# Patient Record
Sex: Male | Born: 1950 | Race: Black or African American | Hispanic: No | Smoking: Current every day smoker
Health system: Southern US, Community
[De-identification: ages and names within clinical notes are randomized; demographics above are authoritative.]

## PROBLEM LIST (undated history)

## (undated) DIAGNOSIS — M549 Dorsalgia, unspecified: Secondary | ICD-10-CM

## (undated) DIAGNOSIS — M1990 Unspecified osteoarthritis, unspecified site: Secondary | ICD-10-CM

## (undated) DIAGNOSIS — E785 Hyperlipidemia, unspecified: Secondary | ICD-10-CM

## (undated) DIAGNOSIS — R011 Cardiac murmur, unspecified: Secondary | ICD-10-CM

## (undated) DIAGNOSIS — G8929 Other chronic pain: Secondary | ICD-10-CM

## (undated) DIAGNOSIS — I10 Essential (primary) hypertension: Secondary | ICD-10-CM

## (undated) DIAGNOSIS — F319 Bipolar disorder, unspecified: Secondary | ICD-10-CM

## (undated) HISTORY — PX: NECK SURGERY: SHX720

---

## 1999-04-21 ENCOUNTER — Encounter: Payer: Self-pay | Admitting: Emergency Medicine

## 1999-04-21 ENCOUNTER — Emergency Department (HOSPITAL_COMMUNITY): Admission: EM | Admit: 1999-04-21 | Discharge: 1999-04-21 | Payer: Self-pay | Admitting: Emergency Medicine

## 2003-05-29 ENCOUNTER — Ambulatory Visit (HOSPITAL_COMMUNITY): Admission: RE | Admit: 2003-05-29 | Discharge: 2003-05-30 | Payer: Self-pay | Admitting: Neurological Surgery

## 2003-07-17 ENCOUNTER — Encounter: Admission: RE | Admit: 2003-07-17 | Discharge: 2003-08-18 | Payer: Self-pay | Admitting: Neurological Surgery

## 2003-10-23 ENCOUNTER — Emergency Department (HOSPITAL_COMMUNITY): Admission: EM | Admit: 2003-10-23 | Discharge: 2003-10-23 | Payer: Self-pay | Admitting: Emergency Medicine

## 2004-10-21 ENCOUNTER — Emergency Department (HOSPITAL_COMMUNITY): Admission: EM | Admit: 2004-10-21 | Discharge: 2004-10-22 | Payer: Self-pay | Admitting: Emergency Medicine

## 2004-11-17 ENCOUNTER — Ambulatory Visit: Payer: Self-pay | Admitting: Gastroenterology

## 2004-12-01 ENCOUNTER — Ambulatory Visit: Payer: Self-pay | Admitting: Gastroenterology

## 2008-04-05 ENCOUNTER — Encounter: Admission: RE | Admit: 2008-04-05 | Discharge: 2008-04-05 | Payer: Self-pay | Admitting: Internal Medicine

## 2010-04-17 ENCOUNTER — Emergency Department (HOSPITAL_COMMUNITY)
Admission: EM | Admit: 2010-04-17 | Discharge: 2010-04-17 | Payer: Self-pay | Source: Home / Self Care | Admitting: Emergency Medicine

## 2010-08-13 NOTE — Op Note (Signed)
Dave Coleman, Dave Coleman                          ACCOUNT NO.:  [id number]   MEDICAL RECORD NO.:  [id number]                   PATIENT TYPE:  OIB   LOCATION:  2899                                 FACILITY:  [facility]   PHYSICIAN:  Stefani Dama, M.D.               DATE OF BIRTH:  09/19/1950   DATE OF PROCEDURE:  05/29/2003  DATE OF DISCHARGE:                                 OPERATIVE REPORT   PREOPERATIVE DIAGNOSIS:  Herniated nucleus pulposus and spondylosis, C5-6  and C6-7 with cervical radiculopathy.   POSTOPERATIVE DIAGNOSIS:  Herniated nucleus pulposus and spondylosis, C5-6  and C6-7 with cervical radiculopathy.   OPERATION PERFORMED:  Anterior cervical decompression, C5-6, C6-7,  arthrodesis with structural allograft and Synthes plate fixation, C5 to C7.   SURGEON:  Stefani Dama, M.D.   ASSISTANT:  Cristi Loron, M.D.   ANESTHESIA:  General endotracheal.   INDICATIONS FOR PROCEDURE:  The patient is a 60 year old individual who has  had significant neck, shoulder and arm pain initially on the left side, now  on both sides.  He has evidence of spondylitic disease at C5-6 with  herniated nucleus pulposus bilaterally and on the left side at C6-7.  He has  been advised conservative treatment and surgical decompression and is now  taken to the operating room.   DESCRIPTION OF PROCEDURE:  The patient was brought to the operating room  supine on a stretcher.  After smooth induction of general endotracheal  anesthesia, he was placed in five pounds of halter traction.  The neck was  shaved, prepped with DuraPrep and draped in sterile fashion.  Transverse  incision was made in the left side of the neck and this was carried down  through the platysma.  The plane between the sternocleidomastoid and strap  muscles was dissected bluntly until the prevertebral space was reached.  The  first identifiable disk space was known to be that of C5-C6.  The longus  colli muscle was stripped  on either side and then a Caspar retractor was  placed in the wound to expose C5-6 and also C6-7.  Ventral osteophytes were  then removed.  The disk space was entered and a significant quantity of  severely degenerated disk material was removed from within the disk space at  C5-C6.  The dissection was carried posteriorly to expose the hypertrophied  lip of the vertebral body of C5 and posteriorly.  This was then taken down  with a high speed air drill and a 2 mm Kerrison punch was used to remove it.  Dissection was carried out laterally to expose the take off of the nerve  root on side.  There was noted to be significant disk material in each  lateral recess.  This was removed and allowed for good decompression.  The  disk space spreader was used to facilitate this dissection.  In the end, the  common dural tube  and the take off for the nerve roots were well dissected  and clear.  A 6 mm round fibular graft cut in lordotic shape was placed into  this interspace after it was filled with the patient's own bone that had  been harvested from the resulting dissection of the uncinate processes.  Attention was then turned to C6-7 where a similar procedure was carried out  here.  On the left side there was significant subligamentous disk herniation  near the region of the foramen.  This was decompressed and again hemostasis  in the lateral recesses was obtained meticulously.  A 7 mm round graft was  again filled with remnants of bone from the lateral dissection and traction  was then removed and the area was inspected and a 40 mm standard size  Synthes plate was contoured to the prevertebral space and then fitted with  six locking 4 x 14 mm screws.  A final localizing radiograph identified good  position of the construct.  The area was then copiously irrigated with  antibiotic irrigating solution.  Hemostasis in the soft tissues was  meticulously obtained.  The wound was then closed with 3-0 Vicryl  in the  platysma and 3-0 Vicryl in the subcuticular tissues.  The procedure was  completed with the help of Dr. Delma Officer who provided retraction,  exposure as I did the dissection of the disk spaces.                                               Stefani Dama, M.D.    Merla Riches  D:  05/29/2003  T:  05/30/2003  Job:  [id number]

## 2012-10-24 ENCOUNTER — Emergency Department (HOSPITAL_COMMUNITY)
Admission: EM | Admit: 2012-10-24 | Discharge: 2012-10-25 | Disposition: A | Discharge status: Home / Self Care | Payer: Medicare Other | Attending: Emergency Medicine | Admitting: Emergency Medicine

## 2012-10-24 ENCOUNTER — Encounter (HOSPITAL_COMMUNITY): Payer: Self-pay | Admitting: *Deleted

## 2012-10-24 DIAGNOSIS — I10 Essential (primary) hypertension: Secondary | ICD-10-CM | POA: Insufficient documentation

## 2012-10-24 DIAGNOSIS — R011 Cardiac murmur, unspecified: Secondary | ICD-10-CM | POA: Insufficient documentation

## 2012-10-24 DIAGNOSIS — Z862 Personal history of diseases of the blood and blood-forming organs and certain disorders involving the immune mechanism: Secondary | ICD-10-CM | POA: Insufficient documentation

## 2012-10-24 DIAGNOSIS — F330 Major depressive disorder, recurrent, mild: Secondary | ICD-10-CM

## 2012-10-24 DIAGNOSIS — F17200 Nicotine dependence, unspecified, uncomplicated: Secondary | ICD-10-CM | POA: Insufficient documentation

## 2012-10-24 DIAGNOSIS — M549 Dorsalgia, unspecified: Secondary | ICD-10-CM | POA: Insufficient documentation

## 2012-10-24 DIAGNOSIS — Z8659 Personal history of other mental and behavioral disorders: Secondary | ICD-10-CM | POA: Insufficient documentation

## 2012-10-24 DIAGNOSIS — Z8739 Personal history of other diseases of the musculoskeletal system and connective tissue: Secondary | ICD-10-CM | POA: Insufficient documentation

## 2012-10-24 DIAGNOSIS — Z79899 Other long term (current) drug therapy: Secondary | ICD-10-CM | POA: Insufficient documentation

## 2012-10-24 DIAGNOSIS — G8929 Other chronic pain: Secondary | ICD-10-CM | POA: Insufficient documentation

## 2012-10-24 DIAGNOSIS — Z8639 Personal history of other endocrine, nutritional and metabolic disease: Secondary | ICD-10-CM | POA: Insufficient documentation

## 2012-10-24 HISTORY — DX: Unspecified osteoarthritis, unspecified site: M19.90

## 2012-10-24 HISTORY — DX: Dorsalgia, unspecified: M54.9

## 2012-10-24 HISTORY — DX: Other chronic pain: G89.29

## 2012-10-24 HISTORY — DX: Essential (primary) hypertension: I10

## 2012-10-24 HISTORY — DX: Hyperlipidemia, unspecified: E78.5

## 2012-10-24 HISTORY — DX: Bipolar disorder, unspecified: F31.9

## 2012-10-24 HISTORY — DX: Cardiac murmur, unspecified: R01.1

## 2012-10-24 LAB — CBC WITH DIFFERENTIAL/PLATELET
Basophils Absolute: 0 10*3/uL (ref 0.0–0.1)
Eosinophils Absolute: 0 10*3/uL (ref 0.0–0.7)
Eosinophils Relative: 1 % (ref 0–5)
HCT: 44.2 % (ref 39.0–52.0)
Lymphocytes Relative: 23 % (ref 12–46)
MCH: 32.7 pg (ref 26.0–34.0)
MCHC: 36.4 g/dL — ABNORMAL HIGH (ref 30.0–36.0)
MCV: 89.8 fL (ref 78.0–100.0)
Monocytes Absolute: 0.5 10*3/uL (ref 0.1–1.0)
RDW: 13.9 % (ref 11.5–15.5)
WBC: 5.1 10*3/uL (ref 4.0–10.5)

## 2012-10-24 LAB — URINALYSIS, ROUTINE W REFLEX MICROSCOPIC
Glucose, UA: NEGATIVE mg/dL
Hgb urine dipstick: NEGATIVE
Ketones, ur: 15 mg/dL — AB
Protein, ur: 30 mg/dL — AB

## 2012-10-24 LAB — URINE MICROSCOPIC-ADD ON

## 2012-10-24 LAB — COMPREHENSIVE METABOLIC PANEL
AST: 23 U/L (ref 0–37)
CO2: 28 mEq/L (ref 19–32)
Calcium: 9.7 mg/dL (ref 8.4–10.5)
Creatinine, Ser: 1.09 mg/dL (ref 0.50–1.35)
GFR calc Af Amer: 83 mL/min — ABNORMAL LOW (ref >90)
GFR calc non Af Amer: 72 mL/min — ABNORMAL LOW (ref >90)

## 2012-10-24 LAB — RAPID URINE DRUG SCREEN, HOSP PERFORMED
Amphetamines: NOT DETECTED
Benzodiazepines: POSITIVE — AB
Opiates: POSITIVE — AB
Tetrahydrocannabinol: NOT DETECTED

## 2012-10-24 MED ORDER — HYDROCODONE-ACETAMINOPHEN 5-325 MG PO TABS: 2.0000 | ORAL_TABLET | Freq: Four times a day (QID) | ORAL | Status: DC | PRN

## 2012-10-24 MED ORDER — PANTOPRAZOLE SODIUM 40 MG PO TBEC: 40.0000 mg | DELAYED_RELEASE_TABLET | Freq: Every day | ORAL | Status: DC

## 2012-10-24 MED ORDER — AMLODIPINE BESYLATE 10 MG PO TABS
10.0000 mg | ORAL_TABLET | Freq: Every day | ORAL | Status: DC
  Filled 2012-10-24: qty 1

## 2012-10-24 NOTE — ED Notes (Signed)
The pt is not feeling like himself for a long time and he has had some outbursts that he does not remember.  He thinks he is reacting to some of his meds he does not know what/.  Strange affect

## 2012-10-24 NOTE — ED Notes (Signed)
[phone] [facility] [place] emergency contact

## 2012-10-24 NOTE — ED Provider Notes (Signed)
CSN: [id number]     Arrival date & time 10/24/12  1849 History     First MD Initiated Contact with Patient 10/24/12 2328     Chief Complaint  Patient presents with  . not feeling like himself     HPI Patient has a history of bipolar disorder as well as manic depression.  He reports that he can emergency department today because he doesn't feel himself.  It sounds as though he had a violent outburst 1-2 weeks ago and is concerned him.  He does not remember the incident.  He denies drug or alcohol abuse.  He states compliance with his medications.  His psychiatrist is at the [facility] in [place].  He did not call psychiatrist today.  He called his cousin and asked that they bring him to the emergency department.  He states he feels scared to go home.  He denies suicidal and homicidal thoughts.  He is unable to elucidate why he feels scared to go home.   History reviewed. No pertinent past medical history. History reviewed. No pertinent past surgical history. No family history on file. History  Substance Use Topics  . Smoking status: Current Every Day Smoker  . Smokeless tobacco: Not on file  . Alcohol Use: No    Review of Systems  All other systems reviewed and are negative.    Allergies  Review of patient's allergies indicates not on file.  Home Medications   Current Outpatient Rx  Name  Route  Sig  Dispense  Refill  . amLODipine (NORVASC) 10 MG tablet   Oral   Take 10 mg by mouth daily.         Marland Kitchen HYDROcodone-acetaminophen (NORCO) 10-325 MG per tablet   Oral   Take 1 tablet by mouth every 6 (six) hours as needed for pain.         Marland Kitchen omeprazole (PRILOSEC) 20 MG capsule   Oral   Take 20 mg by mouth daily.          BP 159/99  Pulse 90  Temp(Src) 97.9 F (36.6 C) (Oral)  Resp 18  Ht 5\' 9"  (1.753 m)  Wt 200 lb (90.719 kg)  BMI 29.52 kg/m2  SpO2 98% Physical Exam  Nursing note and vitals reviewed. Constitutional: He is oriented to person, place, and time. He  appears well-developed and well-nourished.  HENT:  Head: Normocephalic and atraumatic.  Eyes: EOM are normal.  Neck: Normal range of motion.  Cardiovascular: Normal rate, regular rhythm, normal heart sounds and intact distal pulses.   Pulmonary/Chest: Effort normal and breath sounds normal. No respiratory distress.  Abdominal: Soft. He exhibits no distension. There is no tenderness.  Genitourinary: Rectum normal.  Musculoskeletal: Normal range of motion.  Neurological: He is alert and oriented to person, place, and time.  Skin: Skin is warm and dry.  Psychiatric: He has a normal mood and affect. His behavior is normal. Judgment normal. His speech is delayed. Thought content is not paranoid. He expresses no homicidal and no suicidal ideation. He exhibits abnormal recent memory.    ED Course   Procedures (including critical care time)  Labs Reviewed  URINE RAPID DRUG SCREEN (HOSP PERFORMED) - Abnormal; Notable for the following:    Opiates POSITIVE (*)    Benzodiazepines POSITIVE (*)    All other components within normal limits  URINALYSIS, ROUTINE W REFLEX MICROSCOPIC - Abnormal; Notable for the following:    Color, Urine AMBER (*)    Bilirubin Urine LARGE (*)  Ketones, ur 15 (*)    Protein, ur 30 (*)    Leukocytes, UA TRACE (*)    All other components within normal limits  CBC WITH DIFFERENTIAL - Abnormal; Notable for the following:    MCHC 36.4 (*)    All other components within normal limits  COMPREHENSIVE METABOLIC PANEL - Abnormal; Notable for the following:    Potassium 3.3 (*)    Glucose, Bld 141 (*)    Total Protein 8.5 (*)    GFR calc non Af Amer 72 (*)    GFR calc Af Amer 83 (*)    All other components within normal limits  URINE MICROSCOPIC-ADD ON - Abnormal; Notable for the following:    Squamous Epithelial / LPF FEW (*)    Bacteria, UA FEW (*)    All other components within normal limits  ETHANOL   No results found. No diagnosis found.  MDM  Given the  fact that the patient does not visit the emergency room very often it seems to be compliant with his medications.  The last the psychiatrist see the patient emergency department via tele psychiatry services.  Holding orders written.  I suspect that the patient is okay to go home with close outpatient followup however I will rely on out the expertise of the psychiatrist  Lyanne Co, MD 10/25/12 [phone]

## 2012-10-25 ENCOUNTER — Encounter (HOSPITAL_COMMUNITY): Payer: Self-pay | Admitting: Emergency Medicine

## 2012-10-25 NOTE — ED Notes (Signed)
Patient received meal tray.  Once he is finished eating, he will be discharged home.

## 2012-10-25 NOTE — ED Notes (Signed)
Patient has been discharged.  Patient is getting dressed and finishing up with breakfast.   Patient ambulated to restroom and tolerated well.

## 2012-10-25 NOTE — ED Provider Notes (Signed)
Psychiatry consultation is appreciated. Patient does not need inpatient care and is discharged.  Dione Booze, MD 10/25/12 0530

## 2012-10-28 ENCOUNTER — Emergency Department (HOSPITAL_COMMUNITY)
Admission: EM | Admit: 2012-10-28 | Discharge: 2012-10-29 | Disposition: A | Discharge status: Home / Self Care | Payer: Medicare Other | Attending: Emergency Medicine | Admitting: Emergency Medicine

## 2012-10-28 ENCOUNTER — Encounter (HOSPITAL_COMMUNITY): Payer: Self-pay | Admitting: *Deleted

## 2012-10-28 DIAGNOSIS — IMO0002 Reserved for concepts with insufficient information to code with codable children: Secondary | ICD-10-CM | POA: Insufficient documentation

## 2012-10-28 DIAGNOSIS — Z862 Personal history of diseases of the blood and blood-forming organs and certain disorders involving the immune mechanism: Secondary | ICD-10-CM | POA: Insufficient documentation

## 2012-10-28 DIAGNOSIS — Z79899 Other long term (current) drug therapy: Secondary | ICD-10-CM | POA: Insufficient documentation

## 2012-10-28 DIAGNOSIS — F17200 Nicotine dependence, unspecified, uncomplicated: Secondary | ICD-10-CM | POA: Insufficient documentation

## 2012-10-28 DIAGNOSIS — R011 Cardiac murmur, unspecified: Secondary | ICD-10-CM | POA: Insufficient documentation

## 2012-10-28 DIAGNOSIS — I10 Essential (primary) hypertension: Secondary | ICD-10-CM | POA: Insufficient documentation

## 2012-10-28 DIAGNOSIS — F411 Generalized anxiety disorder: Secondary | ICD-10-CM | POA: Insufficient documentation

## 2012-10-28 DIAGNOSIS — R509 Fever, unspecified: Secondary | ICD-10-CM | POA: Insufficient documentation

## 2012-10-28 DIAGNOSIS — Z8659 Personal history of other mental and behavioral disorders: Secondary | ICD-10-CM | POA: Insufficient documentation

## 2012-10-28 DIAGNOSIS — F3132 Bipolar disorder, current episode depressed, moderate: Secondary | ICD-10-CM | POA: Diagnosis present

## 2012-10-28 DIAGNOSIS — G8929 Other chronic pain: Secondary | ICD-10-CM | POA: Insufficient documentation

## 2012-10-28 DIAGNOSIS — R451 Restlessness and agitation: Secondary | ICD-10-CM

## 2012-10-28 DIAGNOSIS — Z8639 Personal history of other endocrine, nutritional and metabolic disease: Secondary | ICD-10-CM | POA: Insufficient documentation

## 2012-10-28 DIAGNOSIS — Z8739 Personal history of other diseases of the musculoskeletal system and connective tissue: Secondary | ICD-10-CM | POA: Insufficient documentation

## 2012-10-28 DIAGNOSIS — F319 Bipolar disorder, unspecified: Secondary | ICD-10-CM | POA: Insufficient documentation

## 2012-10-28 LAB — BASIC METABOLIC PANEL
BUN: 6 mg/dL (ref 6–23)
Calcium: 9.9 mg/dL (ref 8.4–10.5)
GFR calc non Af Amer: 70 mL/min — ABNORMAL LOW (ref >90)
Glucose, Bld: 132 mg/dL — ABNORMAL HIGH (ref 70–99)
Sodium: 139 mEq/L (ref 135–145)

## 2012-10-28 LAB — URINALYSIS, ROUTINE W REFLEX MICROSCOPIC
Ketones, ur: NEGATIVE mg/dL
Nitrite: NEGATIVE
Specific Gravity, Urine: 1.019 (ref 1.005–1.030)
Urobilinogen, UA: 0.2 mg/dL (ref 0.0–1.0)

## 2012-10-28 LAB — CBC WITH DIFFERENTIAL/PLATELET
Basophils Relative: 0 % (ref 0–1)
Eosinophils Absolute: 0 10*3/uL (ref 0.0–0.7)
MCH: 31.6 pg (ref 26.0–34.0)
MCHC: 34.9 g/dL (ref 30.0–36.0)
Neutrophils Relative %: 72 % (ref 43–77)
Platelets: 221 10*3/uL (ref 150–400)
RDW: 13.7 % (ref 11.5–15.5)

## 2012-10-28 LAB — RAPID URINE DRUG SCREEN, HOSP PERFORMED
Cocaine: NOT DETECTED
Opiates: POSITIVE — AB

## 2012-10-28 LAB — ETHANOL: Alcohol, Ethyl (B): <11 mg/dL (ref 0–11)

## 2012-10-28 MED ORDER — PANTOPRAZOLE SODIUM 40 MG PO TBEC
40.0000 mg | DELAYED_RELEASE_TABLET | Freq: Every day | ORAL | Status: DC
  Administered 2012-10-29: 40 mg via ORAL
  Filled 2012-10-28: qty 1

## 2012-10-28 MED ORDER — AMLODIPINE BESYLATE 10 MG PO TABS
10.0000 mg | ORAL_TABLET | Freq: Every day | ORAL | Status: DC
  Administered 2012-10-29: 10 mg via ORAL
  Filled 2012-10-28: qty 1

## 2012-10-28 MED ORDER — ZOLPIDEM TARTRATE 5 MG PO TABS: 5.0000 mg | ORAL_TABLET | Freq: Every evening | ORAL | Status: DC | PRN

## 2012-10-28 MED ORDER — NICOTINE 21 MG/24HR TD PT24: 21.0000 mg | MEDICATED_PATCH | Freq: Every day | TRANSDERMAL | Status: DC

## 2012-10-28 MED ORDER — HYDROCODONE-ACETAMINOPHEN 10-325 MG PO TABS: 1.0000 | ORAL_TABLET | Freq: Four times a day (QID) | ORAL | Status: DC | PRN

## 2012-10-28 MED ORDER — POTASSIUM CHLORIDE CRYS ER 20 MEQ PO TBCR
20.0000 meq | EXTENDED_RELEASE_TABLET | Freq: Once | ORAL | Status: AC
  Administered 2012-10-28: 20 meq via ORAL
  Filled 2012-10-28: qty 1

## 2012-10-28 MED ORDER — ONDANSETRON HCL 4 MG PO TABS: 4.0000 mg | ORAL_TABLET | Freq: Three times a day (TID) | ORAL | Status: DC | PRN

## 2012-10-28 MED ORDER — IBUPROFEN 200 MG PO TABS: 600.0000 mg | ORAL_TABLET | Freq: Three times a day (TID) | ORAL | Status: DC | PRN

## 2012-10-28 NOTE — ED Notes (Signed)
Pt presents with [facility] from [facility], pt not clear as to why he called 911 to go to [facility] other than at some point his doctor from the [place] told him he needs to go for help. Pt has fever and sore throat

## 2012-10-28 NOTE — ED Notes (Signed)
Sister-in-law states pts ex-wife is critically ill and he is having difficulty dealing with it. She also feels he has been drinking ETOH although he denies.

## 2012-10-28 NOTE — ED Notes (Signed)
Pt transferred from main ed, presents for medical clearance.  Pt called 911, requesting some help.  [facility] instructed pt to come in for some help.  Pt will not elaborate further.  Family reports wife is seriously ill.  Pt states he hasn't been feeling like himself.  Pt reports he was feeling hopeless prior to coming in for evaluation.  Denies SI or HI, no AV hallucinations.  Pt states he has been diagnosed with Bipolar DO. Denies street drug or alcohol use.  Pt calm & cooperative at present.

## 2012-10-28 NOTE — ED Notes (Signed)
Pt oriented to person, place, time, month, president.

## 2012-10-29 DIAGNOSIS — F3132 Bipolar disorder, current episode depressed, moderate: Secondary | ICD-10-CM | POA: Diagnosis present

## 2012-10-29 DIAGNOSIS — F3130 Bipolar disorder, current episode depressed, mild or moderate severity, unspecified: Secondary | ICD-10-CM

## 2012-10-29 NOTE — Progress Notes (Signed)
Patient Identification:  Dave Coleman Date of Evaluation:  10/29/2012   History of Present Illness: Patient was brought in by [facility]PD yesterday for depression.  Patient states he usually goes to the [place] [facility] in [place] but he is now living in [place].  Patient has a hx of Bipolar disorder and is taking Paxil.  He states he does not believe his medication is working well. He reports poor sleep and racing thoughts at times.   He rates his depression 5/10 at this time.  He states his visit is to seek a place for medication management but also states he has transportation problems.  Patient states he has strong family support in the area and is willing to see his outpatient Psychiatrist at the [place] in [place].  He denies SI/HI/AVH however he reports hearing voices in the past.  Past Psychiatric History: Bipolar do/ depressed   Past Medical History:     Past Medical History  Diagnosis Date  . Hypertension   . Chronic back pain   . Bipolar disorder     psychiatrist in [place] at [facility]  . Heart murmur   . Hyperlipemia   . DJD (degenerative joint disease)   . Manic depression     psychiatrist in [place] at [facility]       Past Surgical History  Procedure Laterality Date  . Neck surgery      Allergies:  Allergies  Allergen Reactions  . Gabapentin   . Nortriptyline     Current Medications:  Prior to Admission medications   Medication Sig Start Date End Date Taking? Authorizing Provider  amLODipine (NORVASC) 10 MG tablet Take 10 mg by mouth daily.   Yes Historical Provider, MD  HYDROcodone-acetaminophen (NORCO) 10-325 MG per tablet Take 1 tablet by mouth every 6 (six) hours as needed for pain.   Yes Historical Provider, MD  omeprazole (PRILOSEC) 20 MG capsule Take 20 mg by mouth daily.   Yes Historical Provider, MD    Social History:    reports that he has been smoking.  He does not have any smokeless tobacco history on file. He reports that he does not drink alcohol. His drug  history is not on file.   Family History:    No family history on file.  Mental Status Examination/Evaluation:  Patient who look stated age was seen this am awake, alert and oriented x3.  He exhibited some memory and cognitive problems by answering some questions with " I cannot remember.  He is calm and participated in the interview.  He had good eye contacts during the interview process. His attention and concentration was normal.  His thought content and process was normal.  He denied SI/HI/AVH.  His insight and judgement is wnl and his memory is fair.   DIAGNOSIS:   AXIS I   Bipolar depressed, Cognitive d/o nos  AXIS II  Deffered  AXIS III See medical notes.  AXIS IV other psychosocial or environmental problems and problems related to social environment  AXIS V 61-70 mild symptoms     Assessment/Plan: Consult with and face to face interview with Dr Lolly Mustache Patient will be discharged home Appointment is made for him to see his Psychiatrist on August 19 th at [facility] Patient is in agreement with this plan of care.  I have personally seen the patient and agreed with the findings and involved in the treatment plan. Kathryne Sharper, MD

## 2012-10-29 NOTE — Progress Notes (Addendum)
[facility] spoke with [facility], who stated patient had a note to call for an appointment. Next available appointment is 8/19 at 1pm. Patient agreeable to appointment. Patient requested transportation to appointment through [place]. CSW left message with Transportation at [phone] Patient states he has medications at home, and can order medications online as needed for refills.   Catha Gosselin, LCSWA  [phone] .10/29/2012 9:00am    CSW left message for [facility] transportation volunteers at [phone] as patient is ambulatory. Patient plans to follow up with [facility] transportation. CSW asked patient if this writer could speak with family to ensure he got to his next appointment at the [place]. Patient stated this writer could sepak with his cousin, Lacie Draft, however her phone number was at home and he did not know it. Patient stated that patient could not call patient daughter Marylene Land Longest.   Pt verbalized understanding of his appointment on 11/13/2012 at 1pm and he would need to find transportation there. Pt plans to call [facility] transporation at [phone] or seek assistance from [facility]. Patient verbalized understanding of riding home on the bus, and states he has rode the bus before and understand he will need to transfer at the bus depot.   Catha Gosselin, LCSWA  [phone] .8/4/201410:11am

## 2012-10-29 NOTE — Consult Note (Signed)
Reason for Consult:See ED provider note and Psych ROS Referring Physician: ED Providers  Dave Coleman is an 62 y.o. male.  HPI: whose family is concerned about and brought to ED.Apparently he was here 7/30 with same complaints of "not feeling like myself" and Telepsych D/Cd.According to pt he is disabled [place] with Bipolar DO he called his [facility] MD in [place] and was told he was" no longer a patient"(?). He was told to seek help in [place] went to [place] and referred to ED from there.Pt states he called the police to bring him he states he gave them the keys to his house.He reports his doctor (?[place]) told him he could no longer drive so he has no transportation.He reports he has some cousins and a sister in law in [place] who could give him rides if he asked. He reports he takes Paxil but doesn't know the dose and doesn't think it is working.He tested + for opiates (has chronic pain and rx for hydrocodone) and for benzodiazepenes (denies having current rx) .States he had rx for diazepam in past.One family member felt pt might be drinking as a result of his behavior.?  Past Medical History  Diagnosis Date  . Hypertension   . Chronic back pain   . Bipolar disorder     psychiatrist in [place] at [facility]  . Heart murmur   . Hyperlipemia   . DJD (degenerative joint disease)   . Manic depression     psychiatrist in [place] at [facility]    Past Surgical History  Procedure Laterality Date  . Neck surgery      No family history on file.  Social History:  reports that he has been smoking.  He does not have any smokeless tobacco history on file. He reports that he does not drink alcohol. His drug history is not on file.  Allergies:  Allergies  Allergen Reactions  . Gabapentin   . Nortriptyline     Medications: I have reviewed the patient's current medications.  Results for orders placed during the hospital encounter of 10/28/12 (from the past 48 hour(s))  RAPID STREP SCREEN      Status: None   Collection Time    10/28/12  8:24 PM      Result Value Range   Streptococcus, Group A Screen (Direct) NEGATIVE  NEGATIVE   Comment: (NOTE)     A Rapid Antigen test may result negative if the antigen level in the     sample is below the detection level of this test. The FDA has not     cleared this test as a stand-alone test therefore the rapid antigen     negative result has reflexed to a Group A Strep culture.  BASIC METABOLIC PANEL     Status: Abnormal   Collection Time    10/28/12  8:36 PM      Result Value Range   Sodium 139  135 - 145 mEq/L   Potassium 3.2 (*) 3.5 - 5.1 mEq/L   Chloride 99  96 - 112 mEq/L   CO2 28  19 - 32 mEq/L   Glucose, Bld 132 (*) 70 - 99 mg/dL   BUN 6  6 - 23 mg/dL   Creatinine, Ser 1.61  0.50 - 1.35 mg/dL   Calcium 9.9  8.4 - 09.6 mg/dL   GFR calc non Af Amer 70 (*) >90 mL/min   GFR calc Af Amer 81 (*) >90 mL/min   Comment:  The eGFR has been calculated     using the CKD EPI equation.     This calculation has not been     validated in all clinical     situations.     eGFR's persistently     <90 mL/min signify     possible Chronic Kidney Disease.  CBC WITH DIFFERENTIAL     Status: None   Collection Time    10/28/12  8:36 PM      Result Value Range   WBC 6.8  4.0 - 10.5 K/uL   RBC 4.81  4.22 - 5.81 MIL/uL   Hemoglobin 15.2  13.0 - 17.0 g/dL   HCT 47.8  29.5 - 62.1 %   MCV 90.4  78.0 - 100.0 fL   MCH 31.6  26.0 - 34.0 pg   MCHC 34.9  30.0 - 36.0 g/dL   RDW 30.8  65.7 - 84.6 %   Platelets 221  150 - 400 K/uL   Neutrophils Relative % 72  43 - 77 %   Neutro Abs 4.8  1.7 - 7.7 K/uL   Lymphocytes Relative 21  12 - 46 %   Lymphs Abs 1.4  0.7 - 4.0 K/uL   Monocytes Relative 7  3 - 12 %   Monocytes Absolute 0.5  0.1 - 1.0 K/uL   Eosinophils Relative 0  0 - 5 %   Eosinophils Absolute 0.0  0.0 - 0.7 K/uL   Basophils Relative 0  0 - 1 %   Basophils Absolute 0.0  0.0 - 0.1 K/uL  ETHANOL     Status: None   Collection Time     10/28/12  8:36 PM      Result Value Range   Alcohol, Ethyl (B) <11  0 - 11 mg/dL   Comment:            LOWEST DETECTABLE LIMIT FOR     SERUM ALCOHOL IS 11 mg/dL     FOR MEDICAL PURPOSES ONLY  URINE RAPID DRUG SCREEN (HOSP PERFORMED)     Status: Abnormal   Collection Time    10/28/12  9:37 PM      Result Value Range   Opiates POSITIVE (*) NONE DETECTED   Cocaine NONE DETECTED  NONE DETECTED   Benzodiazepines POSITIVE (*) NONE DETECTED   Amphetamines NONE DETECTED  NONE DETECTED   Tetrahydrocannabinol NONE DETECTED  NONE DETECTED   Barbiturates NONE DETECTED  NONE DETECTED   Comment:            DRUG SCREEN FOR MEDICAL PURPOSES     ONLY.  IF CONFIRMATION IS NEEDED     FOR ANY PURPOSE, NOTIFY LAB     WITHIN 5 DAYS.                LOWEST DETECTABLE LIMITS     FOR URINE DRUG SCREEN     Drug Class       Cutoff (ng/mL)     Amphetamine      1000     Barbiturate      200     Benzodiazepine   200     Tricyclics       300     Opiates          300     Cocaine          300     THC              50  URINALYSIS, ROUTINE W  REFLEX MICROSCOPIC     Status: Abnormal   Collection Time    10/28/12  9:38 PM      Result Value Range   Color, Urine AMBER (*) YELLOW   Comment: BIOCHEMICALS MAY BE AFFECTED BY COLOR   APPearance CLOUDY (*) CLEAR   Specific Gravity, Urine 1.019  1.005 - 1.030   pH 6.5  5.0 - 8.0   Glucose, UA NEGATIVE  NEGATIVE mg/dL   Hgb urine dipstick NEGATIVE  NEGATIVE   Bilirubin Urine LARGE (*) NEGATIVE   Ketones, ur NEGATIVE  NEGATIVE mg/dL   Protein, ur NEGATIVE  NEGATIVE mg/dL   Urobilinogen, UA 0.2  0.0 - 1.0 mg/dL   Nitrite NEGATIVE  NEGATIVE   Leukocytes, UA NEGATIVE  NEGATIVE   Comment: MICROSCOPIC NOT DONE ON URINES WITH NEGATIVE PROTEIN, BLOOD, LEUKOCYTES, NITRITE, OR GLUCOSE <1000 mg/dL.    No results found.  Review of Systems  Constitutional: Negative.   HENT: Positive for sore throat and neck pain (S/P cervical laminectomy).   Eyes: Negative.    Respiratory: Negative.        Smoker  Cardiovascular:       Hx of CHF/heart murmur/Hyperlipidemia  Gastrointestinal: Negative.   Genitourinary: Negative.   Musculoskeletal: Positive for back pain (Chronic rx hydrocodone) and joint pain (Hx of DJD).  Skin: Negative.   Neurological: Negative.   Endo/Heme/Allergies: Negative.   Psychiatric/Behavioral: Positive for depression ([facility] no [facility] a [facility]?). Negative for suicidal ideas, hallucinations, memory loss and substance abuse. The patient is not nervous/anxious and does not have insomnia.        Bipolar DO   Blood pressure 144/85, pulse 85, temperature 99.3 F (37.4 C), temperature source Oral, resp. rate 15, height 5\' 9"  (1.753 m), weight 90.719 kg (200 lb), SpO2 99.00%. Physical Exam  Vitals reviewed. Constitutional: He is oriented to person, place, and time. He appears well-developed and well-nourished.  Unshaven/slightly unkempt  HENT:  Head: Normocephalic and atraumatic.  Right Ear: External ear normal.  Left Ear: External ear normal.  Nose: Nose normal.  Eyes: Conjunctivae and EOM are normal. Pupils are equal, round, and reactive to light. Right eye exhibits no discharge. Left eye exhibits no discharge. No scleral icterus.  Neck: Normal range of motion. No JVD present. No tracheal deviation present.  Cardiovascular:  Murmur heard. Tachycardic  Respiratory: Effort normal and breath sounds normal. No stridor.  GI:  Deferred  Genitourinary:  Deferred  Musculoskeletal: Normal range of motion.  Neurological: He is alert and oriented to person, place, and time. He has normal reflexes.  Skin: Skin is warm and dry. No rash noted.  Psychiatric: Judgment normal. His affect is blunt. His speech is delayed. He is slowed. Thought content is not paranoid and not delusional. Cognition and memory are normal. He expresses no homicidal and no suicidal ideation. He expresses no  suicidal plans and no homicidal plans.  MMSE 30    Assessment/Plan:  A- AXIS I-Bipolar Depressed/ ? Medication Reaction (Opiate +Benzo)     AXIS II- Deferred     AXIS III-See PMH/PSH     AXIS IV- Lacks transportation     AXIS V- GAF 50  P- Request Social Work eval in AM to see what can be done about his [facility] and transportation issues and where he can get proper psych med management  Court Joy 10/29/2012, 12:45 AM

## 2012-10-29 NOTE — BH Assessment (Signed)
Assessment Note   Dave Coleman is an 62 y.o. male. Pt presents voluntarily to [facility] via [employer]. Pt states he called EMS because "I felt like I needed some help. I needed to get myself together". Pt unable to elaborate. Pt is oriented x 4 and is calm and polite. Pt states he used to go to a psychiatrist at [facility] until recently when he was told he is no longer pt there. Pt doesn't know why he was dropped as patient. He is slow to answer questions but answers appropriately. Pt denies SI and HI. He denies [facility] and no delusions noted. Pt sts he has hx of bipolar disorder. He sts he hasn't sleep in a couple of days and he endorses poor appetite. Pt endorses depressed mood with loss of interest in usual pleasures and tearfulness. Pt says [employer] has the keys to his house when he called EMS. Pt denies hx of substance use or abuse. He says he has been inpatient at [facility] facilities at [facility] and while in [employer], both admissions for bipolar disorder. Currently taking Paxil as only psych med. Pt states he has trouble with his memory but memory impairment hasn't occurred recently.  Pt's affect is blunted. Pt doesn't meet criteria for inpatient treatment. He needs to be linked to local psychiatrist as he doesn't have transportation.   Axis I: Bipolar I Disorder Axis II: Deferred Axis III:  Past Medical History  Diagnosis Date  . Hypertension   . Chronic back pain   . Bipolar disorder     psychiatrist in [place] at [facility]  . Heart murmur   . Hyperlipemia   . DJD (degenerative joint disease)   . Manic depression     psychiatrist in [place] at [facility]   Axis IV: other psychosocial or environmental problems, problems related to social environment, problems with access to health care services and problems with primary support group Axis V: 41-50 serious symptoms  Past Medical History:  Past Medical History  Diagnosis Date  . Hypertension   . Chronic back pain   . Bipolar disorder     psychiatrist  in [place] at [facility]  . Heart murmur   . Hyperlipemia   . DJD (degenerative joint disease)   . Manic depression     psychiatrist in [place] at [facility]    Past Surgical History  Procedure Laterality Date  . Neck surgery      Family History: No family history on file.  Social History:  reports that he has been smoking.  He does not have any smokeless tobacco history on file. He reports that he does not drink alcohol. His drug history is not on file.  Additional Social History:  Alcohol / Drug Use Pain Medications: see PTA meds list - pt deneis abuse Prescriptions: see PTA meds list - pt denies abuse Over the Counter: see PTA meds list - pt denies abuse History of alcohol / drug use?: No history of alcohol / drug abuse  CIWA: CIWA-Ar BP: 144/85 mmHg Pulse Rate: 85 COWS:    Allergies:  Allergies  Allergen Reactions  . Gabapentin   . Nortriptyline     Home Medications:  (Not in a hospital admission)  OB/GYN Status:  No LMP for male patient.  General Assessment Data Location of Assessment: [facility] Living Arrangements: Alone Can pt return to current living arrangement?: Yes Admission Status: Voluntary Is patient capable of signing voluntary admission?: Yes Transfer from: Acute Hospital Referral Source: Self/Family/Friend  Education Status Is patient currently in school?: No Current Grade: na Highest grade of school patient has completed: 14  Risk to self Suicidal Ideation: No Suicidal Intent: No Is patient at risk for suicide?: No Suicidal Plan?: No Access to Means: No What has been your use of drugs/alcohol within the last 12 months?: none Previous Attempts/Gestures: No How many times?: 0 Other Self Harm Risks: none Triggers for Past Attempts:  (n/a) Intentional Self Injurious Behavior: None Family Suicide History: No Recent stressful life event(s): Other (Comment) (no longer being seen at [facility]) Persecutory voices/beliefs?: No Depression:  Yes Depression Symptoms: Tearfulness;Loss of interest in usual pleasures Substance abuse history and/or treatment for substance abuse?: No Suicide prevention information given to non-admitted patients: Not applicable  Risk to Others Homicidal Ideation: No Thoughts of Harm to Others: No Current Homicidal Intent: No Current Homicidal Plan: No Access to Homicidal Means: No Identified Victim: none History of harm to others?: No Assessment of Violence: None Noted Violent Behavior Description: pt calm and cooperative Does patient have access to weapons?: No Criminal Charges Pending?: No Does patient have a court date: No  Psychosis Hallucinations: None noted Delusions: None noted  Mental Status Report Appear/Hygiene: Other (Comment) (appropriate) Eye Contact: Good Motor Activity: Freedom of movement Speech: Logical/coherent;Slow Level of Consciousness: Alert;Quiet/awake Mood: Depressed;Sad Affect: Blunted Anxiety Level: None Thought Processes: Coherent;Relevant Judgement: Unimpaired Orientation: Person;Place;Time;Situation Obsessive Compulsive Thoughts/Behaviors: None  Cognitive Functioning Concentration: Normal Memory: Remote Impaired;Recent Impaired IQ: Average Insight: Fair Impulse Control: Good Appetite: Fair Weight Loss: 0 Weight Gain: 0 Sleep: Decreased Total Hours of Sleep:  (pt says hasn't slept in two days) Vegetative Symptoms: None  ADLScreening [facility] Assessment Services) Patient's cognitive ability adequate to safely complete daily activities?: Yes Patient able to express need for assistance with ADLs?: Yes Independently performs ADLs?: Yes (appropriate for developmental age)  Abuse/Neglect [facility]) Physical Abuse: Denies Verbal Abuse: Denies Sexual Abuse: Denies  Prior Inpatient Therapy Prior Inpatient Therapy: Yes Prior Therapy Dates: pt doesn't remember dates Prior Therapy Facilty/Provider(s): at [facility] and while in [employer] Reason for  Treatment: bipolar disorder  Prior Outpatient Therapy Prior Outpatient Therapy: Yes Prior Therapy Dates: until recentlyu Prior Therapy Facilty/Provider(s): [facility] [place] Reason for Treatment: med manaagement  ADL Screening (condition at time of admission) Patient's cognitive ability adequate to safely complete daily activities?: Yes Is the patient deaf or have difficulty hearing?: No Does the patient have difficulty seeing, even when wearing glasses/contacts?: No Does the patient have difficulty concentrating, remembering, or making decisions?: Yes Patient able to express need for assistance with ADLs?: Yes Does the patient have difficulty dressing or bathing?: No Independently performs ADLs?: Yes (appropriate for developmental age) Does the patient have difficulty walking or climbing stairs?: No Weakness of Legs: None Weakness of Arms/Hands: None  Home Assistive Devices/Equipment Home Assistive Devices/Equipment: None    Abuse/Neglect Assessment (Assessment to be complete while patient is alone) Physical Abuse: Denies Verbal Abuse: Denies Sexual Abuse: Denies Exploitation of patient/patient's resources: Denies Self-Neglect: Denies Values / Beliefs Cultural Requests During Hospitalization: None Spiritual Requests During Hospitalization: None   Advance Directives (For Healthcare) Advance Directive: Patient does not have advance directive;Patient would not like information    Additional Information 1:1 In Past 12 Months?: No CIRT Risk: No Elopement Risk: No Does patient have medical clearance?: Yes     Disposition:  Disposition Initial Assessment Completed for this Encounter: Yes Disposition of Patient: Outpatient treatment Type of outpatient treatment: Adult  On Site Evaluation by:   Reviewed with  Physician:     Donnamarie Rossetti P 10/29/2012 12:40 AM

## 2012-10-30 LAB — CULTURE, GROUP A STREP

## 2012-11-02 NOTE — ED Provider Notes (Signed)
CSN: [id number]     Arrival date & time 10/28/12  2007 History     First MD Initiated Contact with Patient 10/28/12 2021     Chief Complaint  Patient presents with  . Fever  . Medical Clearance   (Consider location/radiation/quality/duration/timing/severity/associated sxs/prior Treatment) HPI Comments: Patient states he is unsure why he is in the emergency department, that he did go to [facility] for help.  He, states he just does not feel like himself.  He also states, that he's been treated at the [place], but was told not to come back.  He does have a history of hypertension.  Chronic back pain.  Bipolar disorder, hyperlipidemia, and depression.  He, states he has not been taking his medicine for a few days.  Family, who accompanies him says that he put a finger on exactly.  The difference, but he is not himself.  They do report that his ex-wife is critically ill and he is taking this news quite badly  Patient is a 62 y.o. male presenting with fever. The history is provided by the patient.  Fever Severity:  Moderate Associated symptoms: no chest pain and no nausea     Past Medical History  Diagnosis Date  . Hypertension   . Chronic back pain   . Bipolar disorder     psychiatrist in [place] at [facility]  . Heart murmur   . Hyperlipemia   . DJD (degenerative joint disease)   . Manic depression     psychiatrist in [place] at [facility]   Past Surgical History  Procedure Laterality Date  . Neck surgery     No family history on file. History  Substance Use Topics  . Smoking status: Current Every Day Smoker  . Smokeless tobacco: Not on file  . Alcohol Use: No    Review of Systems  Constitutional: Positive for fever.  Respiratory: Negative for shortness of breath.   Cardiovascular: Negative for chest pain.  Gastrointestinal: Negative for nausea and abdominal pain.  Psychiatric/Behavioral: The patient is nervous/anxious.   All other systems reviewed and are  negative.    Allergies  Gabapentin and Nortriptyline  Home Medications   Current Outpatient Rx  Name  Route  Sig  Dispense  Refill  . amLODipine (NORVASC) 10 MG tablet   Oral   Take 10 mg by mouth daily.         Marland Kitchen HYDROcodone-acetaminophen (NORCO) 10-325 MG per tablet   Oral   Take 1 tablet by mouth every 6 (six) hours as needed for pain.         Marland Kitchen omeprazole (PRILOSEC) 20 MG capsule   Oral   Take 20 mg by mouth daily.          BP 129/71  Pulse 69  Temp(Src) 99.2 F (37.3 C) (Oral)  Resp 18  Ht 5\' 9"  (1.753 m)  Wt 200 lb (90.719 kg)  BMI 29.52 kg/m2  SpO2 96% Physical Exam  Nursing note and vitals reviewed. Constitutional: He appears well-developed and well-nourished.  Neck: Normal range of motion.  Cardiovascular: Normal rate.   Pulmonary/Chest: Effort normal.  Abdominal: Soft.  Musculoskeletal: Normal range of motion.  Neurological: He is alert.  Skin: Skin is warm.  Psychiatric: His speech is normal. Judgment normal. He is slowed. Cognition and memory are impaired. He exhibits a depressed mood. He expresses no homicidal and no suicidal ideation. He expresses no suicidal plans and no homicidal plans.  Patient is very vague in his symptomology.  Does not make good.  Eye contact    ED Course   Procedures (including critical care time)  Labs Reviewed  BASIC METABOLIC PANEL - Abnormal; Notable for the following:    Potassium 3.2 (*)    Glucose, Bld 132 (*)    GFR calc non Af Amer 70 (*)    GFR calc Af Amer 81 (*)    All other components within normal limits  URINE RAPID DRUG SCREEN (HOSP PERFORMED) - Abnormal; Notable for the following:    Opiates POSITIVE (*)    Benzodiazepines POSITIVE (*)    All other components within normal limits  URINALYSIS, ROUTINE W REFLEX MICROSCOPIC - Abnormal; Notable for the following:    Color, Urine AMBER (*)    APPearance CLOUDY (*)    Bilirubin Urine LARGE (*)    All other components within normal limits  RAPID  STREP SCREEN  CULTURE, GROUP A STREP  CBC WITH DIFFERENTIAL  ETHANOL   No results found. 1. Agitation   2. Bipolar affective disorder     MDM  Patient medically cleared, and move to the psychiatric area for assessment  Arman Filter, NP 11/02/12 2211

## 2012-11-03 NOTE — ED Provider Notes (Signed)
Medical screening examination/treatment/procedure(s) were performed by non-physician practitioner and as supervising physician I was immediately available for consultation/collaboration.   Charles B. Bernette Mayers, MD 11/03/12 2047

## 2014-10-17 ENCOUNTER — Encounter: Payer: Self-pay | Admitting: Gastroenterology

## 2014-10-20 ENCOUNTER — Encounter: Payer: Self-pay | Admitting: Gastroenterology

## 2015-01-27 ENCOUNTER — Encounter (HOSPITAL_COMMUNITY): Payer: Self-pay | Admitting: Emergency Medicine

## 2015-01-27 ENCOUNTER — Emergency Department (HOSPITAL_COMMUNITY): Payer: Medicare Other

## 2015-01-27 ENCOUNTER — Emergency Department (HOSPITAL_COMMUNITY)
Admission: EM | Admit: 2015-01-27 | Discharge: 2015-01-27 | Disposition: A | Discharge status: Home / Self Care | Payer: Medicare Other | Attending: Emergency Medicine | Admitting: Emergency Medicine

## 2015-01-27 DIAGNOSIS — G8929 Other chronic pain: Secondary | ICD-10-CM | POA: Insufficient documentation

## 2015-01-27 DIAGNOSIS — Z79899 Other long term (current) drug therapy: Secondary | ICD-10-CM | POA: Insufficient documentation

## 2015-01-27 DIAGNOSIS — Z720 Tobacco use: Secondary | ICD-10-CM | POA: Insufficient documentation

## 2015-01-27 DIAGNOSIS — R011 Cardiac murmur, unspecified: Secondary | ICD-10-CM | POA: Diagnosis not present

## 2015-01-27 DIAGNOSIS — R079 Chest pain, unspecified: Secondary | ICD-10-CM | POA: Diagnosis present

## 2015-01-27 DIAGNOSIS — R0789 Other chest pain: Secondary | ICD-10-CM | POA: Diagnosis not present

## 2015-01-27 DIAGNOSIS — F319 Bipolar disorder, unspecified: Secondary | ICD-10-CM | POA: Insufficient documentation

## 2015-01-27 DIAGNOSIS — Z8639 Personal history of other endocrine, nutritional and metabolic disease: Secondary | ICD-10-CM | POA: Insufficient documentation

## 2015-01-27 DIAGNOSIS — I10 Essential (primary) hypertension: Secondary | ICD-10-CM | POA: Diagnosis not present

## 2015-01-27 DIAGNOSIS — Z8739 Personal history of other diseases of the musculoskeletal system and connective tissue: Secondary | ICD-10-CM | POA: Insufficient documentation

## 2015-01-27 LAB — BASIC METABOLIC PANEL
ANION GAP: 8 (ref 5–15)
BUN: 14 mg/dL (ref 6–20)
CHLORIDE: 104 mmol/L (ref 101–111)
CO2: 24 mmol/L (ref 22–32)
Calcium: 9.4 mg/dL (ref 8.9–10.3)
Creatinine, Ser: 1.34 mg/dL — ABNORMAL HIGH (ref 0.61–1.24)
GFR calc Af Amer: >60 mL/min (ref >60)
GFR calc non Af Amer: 55 mL/min — ABNORMAL LOW (ref >60)
GLUCOSE: 116 mg/dL — AB (ref 65–99)
POTASSIUM: 3.6 mmol/L (ref 3.5–5.1)
Sodium: 136 mmol/L (ref 135–145)

## 2015-01-27 LAB — CBC
HEMATOCRIT: 45.3 % (ref 39.0–52.0)
HEMOGLOBIN: 16.2 g/dL (ref 13.0–17.0)
MCH: 32.3 pg (ref 26.0–34.0)
MCHC: 35.8 g/dL (ref 30.0–36.0)
MCV: 90.4 fL (ref 78.0–100.0)
Platelets: 190 10*3/uL (ref 150–400)
RBC: 5.01 MIL/uL (ref 4.22–5.81)
RDW: 14.1 % (ref 11.5–15.5)
WBC: 4.7 10*3/uL (ref 4.0–10.5)

## 2015-01-27 LAB — I-STAT TROPONIN, ED: Troponin i, poc: 0 ng/mL (ref 0.00–0.08)

## 2015-01-27 MED ORDER — OMEPRAZOLE 20 MG PO CPDR: 20.0000 mg | DELAYED_RELEASE_CAPSULE | Freq: Every day | ORAL | Status: DC

## 2015-01-27 MED ORDER — GI COCKTAIL ~~LOC~~
30.0000 mL | Freq: Once | ORAL | Status: AC
  Administered 2015-01-27: 30 mL via ORAL
  Filled 2015-01-27: qty 30

## 2015-01-27 MED ORDER — SUCRALFATE 1 GM/10ML PO SUSP: 1.0000 g | Freq: Three times a day (TID) | ORAL | Status: DC

## 2015-01-27 NOTE — ED Provider Notes (Signed)
CSN: [id number]     Arrival date & time 01/27/15  1459 History   First MD Initiated Contact with Patient 01/27/15 1626     Chief Complaint  Patient presents with  . Chest Pain     HPI  Patient presents with concern of several days of chest pain, left arm discomfort. Symptoms began about 5 days ago, after the patient was eating. Since onset symptoms of been episodic, seemingly occurring in concurrence with eating. Symptoms are also worse with supine positioning. There is pressure like sensation in the upper chest, and today in the left upper arm. Mild associated nausea, no vomiting, no diarrhea, no fever, chills, no dyspnea. Patient denies a history of coronary disease, acknowledges history of hypertension. Patient smokes.  Since onset, no clear alleviating orogastric factors beyond positioning, diet.   Smoking cessation provided, particularly in light of this patient's evaluation in the ED.   Past Medical History  Diagnosis Date  . Hypertension   . Chronic back pain   . Bipolar disorder [facility])     psychiatrist in [place] at [facility]  . Heart murmur   . Hyperlipemia   . DJD (degenerative joint disease)   . Manic depression [facility])     psychiatrist in [facility] at [facility]   Past Surgical History  Procedure Laterality Date  . Neck surgery     History reviewed. No pertinent family history. Social History  Substance Use Topics  . Smoking status: Current Every Day Smoker  . Smokeless tobacco: None  . Alcohol Use: No    Review of Systems  Constitutional:       Per HPI, otherwise negative  HENT:       Per HPI, otherwise negative  Respiratory:       Per HPI, otherwise negative  Cardiovascular:       Per HPI, otherwise negative  Gastrointestinal: Negative for vomiting.  Endocrine:       Negative aside from HPI  Genitourinary:       Neg aside from HPI   Musculoskeletal:       Per HPI, otherwise negative  Skin: Negative.   Neurological: Negative for syncope.       Allergies  Gabapentin and Nortriptyline  Home Medications   Prior to Admission medications   Medication Sig Start Date End Date Taking? Authorizing Provider  amLODipine (NORVASC) 10 MG tablet Take 10 mg by mouth daily.   Yes Historical Provider, MD  methocarbamol (ROBAXIN) 500 MG tablet Take 500 mg by mouth 2 (two) times daily as needed for muscle spasms.   Yes Historical Provider, MD  PARoxetine (PAXIL) 20 MG tablet Take 30 mg by mouth at bedtime.   Yes Historical Provider, MD  terazosin (HYTRIN) 5 MG capsule Take 5 mg by mouth at bedtime.   Yes Historical Provider, MD  traZODone (DESYREL) 50 MG tablet Take 50 mg by mouth at bedtime.   Yes Historical Provider, MD  omeprazole (PRILOSEC) 20 MG capsule Take 1 capsule (20 mg total) by mouth daily. 01/27/15   Carmin Muskrat, MD  sucralfate (CARAFATE) 1 GM/10ML suspension Take 10 mLs (1 g total) by mouth 4 (four) times daily -  with meals and at bedtime. 01/27/15 02/03/15  Carmin Muskrat, MD   BP 174/90 mmHg  Pulse 70  Temp(Src) 97.7 F (36.5 C) (Oral)  Resp 16  SpO2 99% Physical Exam  Constitutional: He is oriented to person, place, and time. He appears well-developed. No distress.  HENT:  Head: Normocephalic and atraumatic.  Eyes: Conjunctivae and EOM are normal.  Cardiovascular: Normal rate and regular rhythm.   Pulmonary/Chest: Effort normal. No stridor. No respiratory distress.  Abdominal: He exhibits no distension.  Musculoskeletal: He exhibits no edema.  Neurological: He is alert and oriented to person, place, and time.  Skin: Skin is warm and dry.  Psychiatric: He has a normal mood and affect.  Nursing note and vitals reviewed.   ED Course  Procedures (including critical care time) Labs Review Labs Reviewed  BASIC METABOLIC PANEL - Abnormal; Notable for the following:    Glucose, Bld 116 (*)    Creatinine, Ser 1.34 (*)    GFR calc non Af Amer 55 (*)    All other components within normal limits  CBC  I-STAT  TROPOININ, ED    Imaging Review Dg Chest 2 View  01/27/2015  CLINICAL DATA:  Generalized chest pain radiating down left arm for 3-4 days. EXAM: CHEST  2 VIEW COMPARISON:  05/27/2003 FINDINGS: The heart size and mediastinal contours are within normal limits. Both lungs are clear. The visualized skeletal structures are unremarkable. IMPRESSION: No active cardiopulmonary disease. Electronically Signed   By: Rolm Baptise M.D.   On: 01/27/2015 16:12   I have personally reviewed and evaluated these images and lab results as part of my medical decision-making.   EKG Interpretation   Date/Time:  Tuesday January 27 2015 15:41:28 EDT Ventricular Rate:  75 PR Interval:  176 QRS Duration: 77 QT Interval:  392 QTC Calculation: 438 R Axis:   -121 Text Interpretation:  Sinus rhythm Markedly posterior QRS axis Nonspecific  T abnormalities, inferior leads Sinus rhythm T wave abnormality Abnormal  ekg Confirmed by Carmin Muskrat  MD ([id number]) on 01/27/2015 4:28:40 PM     5:55 PM Patient in no distress, resting, seemingly comfortably. We discussed all findings, and initiation of medication, with presumed gastroesophageal etiology for his discomfort.  MDM   Final diagnoses:  Atypical chest pain    Patient presents with days of ongoing episodic chest pain. With patient's unremarkable EKG, normal troponin, there is low suspicion for ongoing ACS. Patient description of pain that occurs with eating suggests gastroesophageal etiology. No evidence for infection. Discharged in stable condition with new medication to help with gastroenterology and with his primary care physician.  Carmin Muskrat, MD 01/27/15 [phone]

## 2015-01-27 NOTE — Discharge Instructions (Signed)
As discussed in your pain and discomfort are likely a result of inflammation of your stomach and esophagus.  Please take all medication as directed and follow-up with your physicians.  Return here for concerning changes in your condition.

## 2015-01-27 NOTE — ED Notes (Signed)
Pt reports generalized CP with radiation to L arm that started at 1300 along with lightheadedness. Pt reports he has been having intermittent CP for some time but became worse today. Symptoms begin after eating.

## 2015-01-29 ENCOUNTER — Telehealth: Payer: Self-pay | Admitting: Gastroenterology

## 2015-01-29 NOTE — Telephone Encounter (Signed)
I have contacted the patient and scheduled him.

## 2015-02-16 ENCOUNTER — Ambulatory Visit (INDEPENDENT_AMBULATORY_CARE_PROVIDER_SITE_OTHER): Payer: Medicare Other | Admitting: Gastroenterology

## 2015-02-16 ENCOUNTER — Encounter: Payer: Self-pay | Admitting: Gastroenterology

## 2015-02-16 VITALS — BP 130/82 | HR 80 | Ht 69.5 in | Wt 212.0 lb

## 2015-02-16 DIAGNOSIS — R0789 Other chest pain: Secondary | ICD-10-CM

## 2015-02-16 NOTE — Patient Instructions (Signed)
We will attempt to get your records from the [facility] hospital.   Please call back if you have not heard anything from [place] in 2 weeks.

## 2015-02-16 NOTE — Progress Notes (Signed)
02/16/2015 Seven Wichert [id number] 05-27-1950   HISTORY OF PRESENT ILLNESS:  This is a 64 year old male who is previously known to Dr. Deatra Ina in 2006 for colonoscopy. The study was normal at that time and it was recommended he have a repeat procedure in 10 years. He has not been seen back here at all since then. In fact, he had another colonoscopy since 2006 at the [place].  Anyway, he presents to our office today as a referral from the ER for evaluation regarding atypical chest pain that is thought to be noncardiac related. The patient presented to the hospital on November 1 with complaints of pain across his chest some localized to the left. EKG, chest x-ray, troponins were negative. He was given GI cocktail with improvement of his symptoms. He was started on omeprazole 20 mg daily and Carafate suspension 4 times a day; he has continued those medications and is feeling almost 100% improved. He said that he did not feel like he was having any type of heartburn or reflux symptoms leading up to any of this. The pain somewhat had buildup and worsened over about a 5 day course.  No dysphagia, weight loss, etc.   Past Medical History  Diagnosis Date  . Hypertension   . Chronic back pain   . Bipolar disorder [facility])     psychiatrist in [place] at [facility]  . Heart murmur   . Hyperlipemia   . DJD (degenerative joint disease)   . Manic depression [facility])     psychiatrist in [facility] at [facility]   Past Surgical History  Procedure Laterality Date  . Neck surgery      reports that he has been smoking.  He has never used smokeless tobacco. He reports that he drinks alcohol. He reports that he does not use illicit drugs. family history is not on file. Allergies  Allergen Reactions  . Gabapentin     Unknown reaction   . Nortriptyline     Unknown reaction       Outpatient Encounter Prescriptions as of 02/16/2015  Medication Sig  . amLODipine (NORVASC) 10 MG tablet Take 10 mg by mouth  daily.  . methocarbamol (ROBAXIN) 500 MG tablet Take 500 mg by mouth 2 (two) times daily as needed for muscle spasms.  Marland Kitchen omeprazole (PRILOSEC) 20 MG capsule Take 1 capsule (20 mg total) by mouth daily.  Marland Kitchen PARoxetine (PAXIL) 20 MG tablet Take 30 mg by mouth at bedtime.  . sucralfate (CARAFATE) 1 GM/10ML suspension Take 10 mLs (1 g total) by mouth 4 (four) times daily -  with meals and at bedtime.  Marland Kitchen terazosin (HYTRIN) 5 MG capsule Take 5 mg by mouth at bedtime.  . traZODone (DESYREL) 50 MG tablet Take 50 mg by mouth at bedtime.   No facility-administered encounter medications on file as of 02/16/2015.     REVIEW OF SYSTEMS  : All other systems reviewed and negative except where noted in the History of Present Illness.   PHYSICAL EXAM: BP 130/82 mmHg  Pulse 80  Ht 5' 9.5" (1.765 m)  Wt 212 lb (96.163 kg)  BMI 30.87 kg/m2 General: Well developed black male in no acute distress Head: Normocephalic and atraumatic Eyes:  Sclerae anicteric, conjunctiva pink. Ears: Normal auditory acuity Lungs: Clear throughout to auscultation Heart: Regular rate and rhythm Abdomen: Soft, non-distended.  Normal bowel sounds.  Non-tender. Musculoskeletal: Symmetrical with no gross deformities  Skin: No lesions on visible extremities Extremities: No edema  Neurological: Alert  oriented x 4, grossly non-focal Psychological:  Alert and cooperative. Normal mood and affect  ASSESSMENT AND PLAN: -Atypical chest pain:  Negative cardiac evaluation in the ER. Symptoms resolved with daily PPI and Carafate suspension. Suspect probably reflux related. We discussed EGD.  He thinks he may be due for colonoscopy again in the near future. His last procedure was at the [place] so we will try to obtain those records. If he in fact does need a colonoscopy we could consider performing both the same time. If not then we'll perform EGD alone. We will request those records. He will call our office back if he does not hear from [place] in 2  weeks with update to the plan. In the interim he will continue omeprazole daily. No need to continue the Carafate further after current prescription.   *His care will be assumed by Dr. Havery Moros in Dr. Kelby Fam absence.  CC:  No ref. provider found

## 2015-02-16 NOTE — Progress Notes (Signed)
Agree with assessment and plan as outlined.  

## 2015-02-25 ENCOUNTER — Telehealth: Payer: Self-pay | Admitting: Gastroenterology

## 2015-02-25 MED ORDER — OMEPRAZOLE 20 MG PO CPDR: 20.0000 mg | DELAYED_RELEASE_CAPSULE | Freq: Every day | ORAL | Status: AC

## 2015-02-25 NOTE — Telephone Encounter (Signed)
Per Alonza Bogus PA, OK to refill Omeprazole 20 mg, 1 tab daily, # 30 with 5 refills, today 02-25-2015.

## 2015-08-09 ENCOUNTER — Other Ambulatory Visit: Payer: Self-pay | Admitting: Gastroenterology

## 2016-04-30 ENCOUNTER — Ambulatory Visit (HOSPITAL_COMMUNITY)
Admission: EM | Admit: 2016-04-30 | Discharge: 2016-04-30 | Disposition: A | Discharge status: Home / Self Care | Payer: Medicare Other | Attending: Family Medicine | Admitting: Family Medicine

## 2016-04-30 ENCOUNTER — Encounter (HOSPITAL_COMMUNITY): Payer: Self-pay | Admitting: Family Medicine

## 2016-04-30 DIAGNOSIS — R05 Cough: Secondary | ICD-10-CM

## 2016-04-30 DIAGNOSIS — R059 Cough, unspecified: Secondary | ICD-10-CM

## 2016-04-30 DIAGNOSIS — J039 Acute tonsillitis, unspecified: Secondary | ICD-10-CM | POA: Diagnosis not present

## 2016-04-30 DIAGNOSIS — H6122 Impacted cerumen, left ear: Secondary | ICD-10-CM | POA: Diagnosis not present

## 2016-04-30 MED ORDER — HYDROCODONE-HOMATROPINE 5-1.5 MG/5ML PO SYRP: 5.0000 mL | ORAL_SOLUTION | Freq: Four times a day (QID) | ORAL | 0 refills | Status: AC | PRN

## 2016-04-30 MED ORDER — AMOXICILLIN-POT CLAVULANATE 875-125 MG PO TABS: 1.0000 | ORAL_TABLET | Freq: Two times a day (BID) | ORAL | 0 refills | Status: AC

## 2016-04-30 NOTE — Discharge Instructions (Signed)
You have tonsillitis. The antibiotic for this has been sent to your pharmacy. Please return if you're still having symptoms by Tuesday.

## 2016-04-30 NOTE — ED Triage Notes (Signed)
Pt here for URI symptoms and left ear pain and congestion.

## 2016-04-30 NOTE — ED Provider Notes (Signed)
[facility]    CSN: [id number] Arrival date & time: 04/30/16  1630     History   Chief Complaint Chief Complaint  Patient presents with  . URI    HPI Dave Coleman is a 66 y.o. male.   This is a 66 year old man is here for left ear pain, sore throat and congestion. Symptoms began 4 days ago and were associated with fever at the onset. The fever is diminished but his throat is getting more painful.  Patient is retired      Past Medical History:  Diagnosis Date  . Bipolar disorder [facility])    psychiatrist in [place] at [facility]  . Chronic back pain   . DJD (degenerative joint disease)   . Heart murmur   . Hyperlipemia   . Hypertension   . Manic depression [facility])    psychiatrist in [facility] at [facility]    Patient Active Problem List   Diagnosis Date Noted  . Atypical chest pain 02/16/2015  . Bipolar 1 disorder, depressed, moderate ([place]) 10/29/2012    Past Surgical History:  Procedure Laterality Date  . NECK SURGERY         Home Medications    Prior to Admission medications   Medication Sig Start Date End Date Taking? Authorizing Provider  amLODipine (NORVASC) 10 MG tablet Take 10 mg by mouth daily.    Historical Provider, MD  amoxicillin-clavulanate (AUGMENTIN) 875-125 MG tablet Take 1 tablet by mouth every 12 (twelve) hours. 04/30/16   Robyn Haber, MD  HYDROcodone-homatropine (HYDROMET) 5-1.5 MG/5ML syrup Take 5 mLs by mouth every 6 (six) hours as needed for cough. 04/30/16   Robyn Haber, MD  methocarbamol (ROBAXIN) 500 MG tablet Take 500 mg by mouth 2 (two) times daily as needed for muscle spasms.    Historical Provider, MD  omeprazole (PRILOSEC) 20 MG capsule Take 1 capsule (20 mg total) by mouth daily. 02/25/15   Jessica D Zehr, PA-C  PARoxetine (PAXIL) 20 MG tablet Take 30 mg by mouth at bedtime.    Historical Provider, MD  terazosin (HYTRIN) 5 MG capsule Take 5 mg by mouth at bedtime.    Historical Provider, MD  traZODone (DESYREL) 50  MG tablet Take 50 mg by mouth at bedtime.    Historical Provider, MD    Family History History reviewed. No pertinent family history.  Social History Social History  Substance Use Topics  . Smoking status: Current Every Day Smoker  . Smokeless tobacco: Never Used  . Alcohol use 0.0 oz/week     Comment: rarely      Allergies   Gabapentin and Nortriptyline   Review of Systems Review of Systems  Constitutional: Positive for diaphoresis.  HENT: Positive for congestion, ear pain and sore throat.   Respiratory: Positive for cough.   Gastrointestinal: Negative.   Genitourinary: Negative.   Neurological: Negative.      Physical Exam Triage Vital Signs ED Triage Vitals  Enc Vitals Group     BP 04/30/16 1728 145/81     Pulse Rate 04/30/16 1728 87     Resp 04/30/16 1728 18     Temp 04/30/16 1728 98.6 F (37 C)     Temp Source 04/30/16 1732 Oral     SpO2 04/30/16 1728 100 %     Weight --      Height --      Head Circumference --      Peak Flow --      Pain Score 04/30/16 1727  3     Pain Loc --      Pain Edu? --      Excl. in [place]? --    No data found.   Updated Vital Signs BP 145/81   Pulse 87   Temp 99.1 F (37.3 C) (Oral)   Resp 18   SpO2 100%    Physical Exam  Constitutional: He is oriented to person, place, and time. He appears well-developed and well-nourished.  HENT:  Head: Normocephalic.  Right Ear: External ear normal.  Left Ear: External ear normal.  Mouth/Throat: Oropharyngeal exudate present.  Cerumen impaction on the left  Patient has swollen tonsillar pillar  Eyes: Conjunctivae are normal. Pupils are equal, round, and reactive to light.  Neck: Normal range of motion. Neck supple.  Cardiovascular: Normal rate and regular rhythm.   Pulmonary/Chest: Effort normal. He has rales.  Musculoskeletal: Normal range of motion.  Neurological: He is alert and oriented to person, place, and time.  Skin: Skin is warm and dry.  Nursing note and vitals  reviewed.    UC Treatments / Results  Labs (all labs ordered are listed, but only abnormal results are displayed) Labs Reviewed - No data to display  EKG  EKG Interpretation None       Radiology No results found.  Procedures Procedures (including critical care time)  Medications Ordered in UC Medications - No data to display   Initial Impression / Assessment and Plan / UC Course  I have reviewed the triage vital signs and the nursing notes.  Pertinent labs & imaging results that were available during my care of the patient were reviewed by me and considered in my medical decision making (see chart for details).     Final Clinical Impressions(s) / UC Diagnoses   Final diagnoses:  Tonsillitis  Cough  Impacted cerumen of left ear    New Prescriptions New Prescriptions   AMOXICILLIN-CLAVULANATE (AUGMENTIN) 875-125 MG TABLET    Take 1 tablet by mouth every 12 (twelve) hours.   HYDROCODONE-HOMATROPINE (HYDROMET) 5-1.5 MG/5ML SYRUP    Take 5 mLs by mouth every 6 (six) hours as needed for cough.     Robyn Haber, MD 04/30/16 [phone]

## 2016-08-03 IMAGING — CR DG CHEST 2V
2 series · 2 of 2 positions shown · non-contrast
Comparison: 05/27/2003

CLINICAL DATA: Generalized chest pain radiating down left arm for
3-4 days.

EXAM:
CHEST  2 VIEW

[w chest pa]
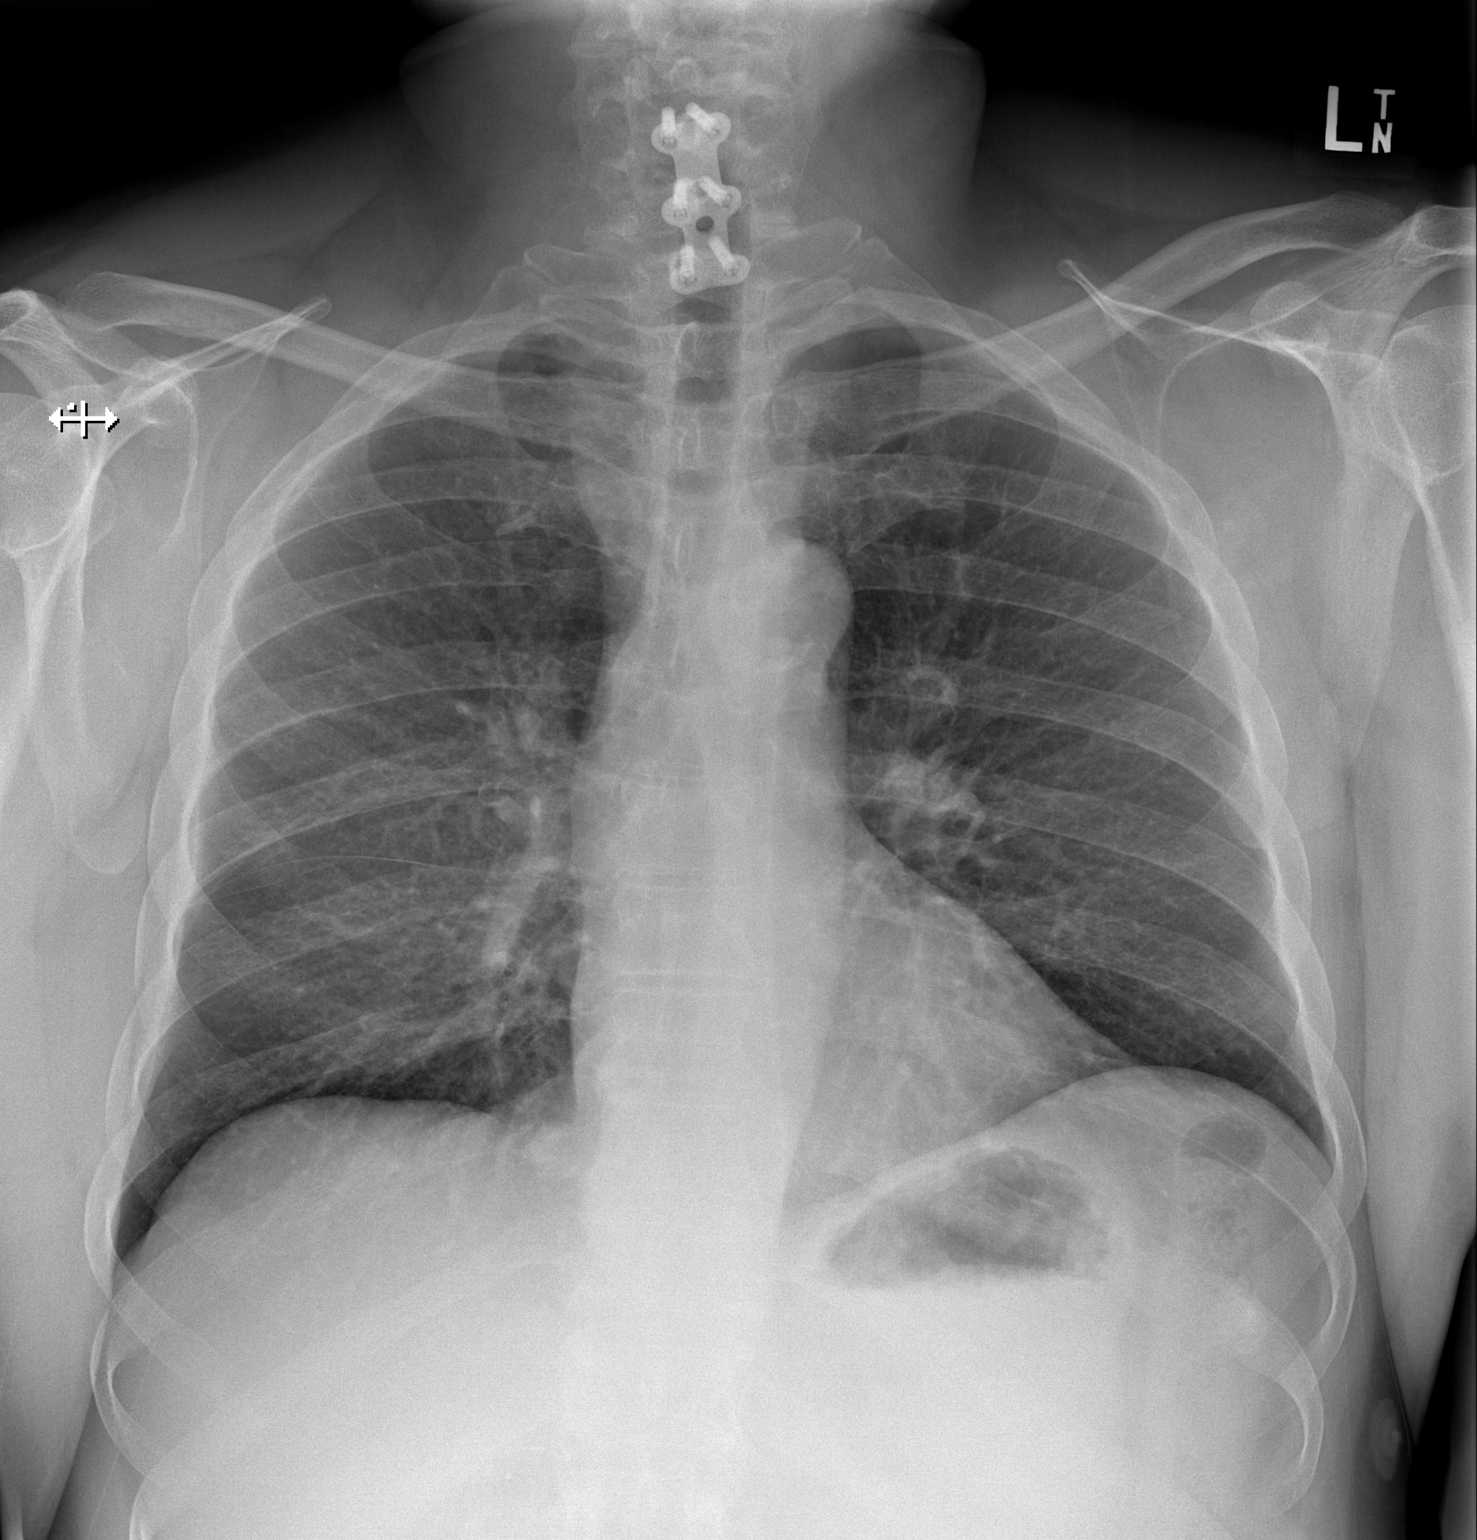

[w chest lat]
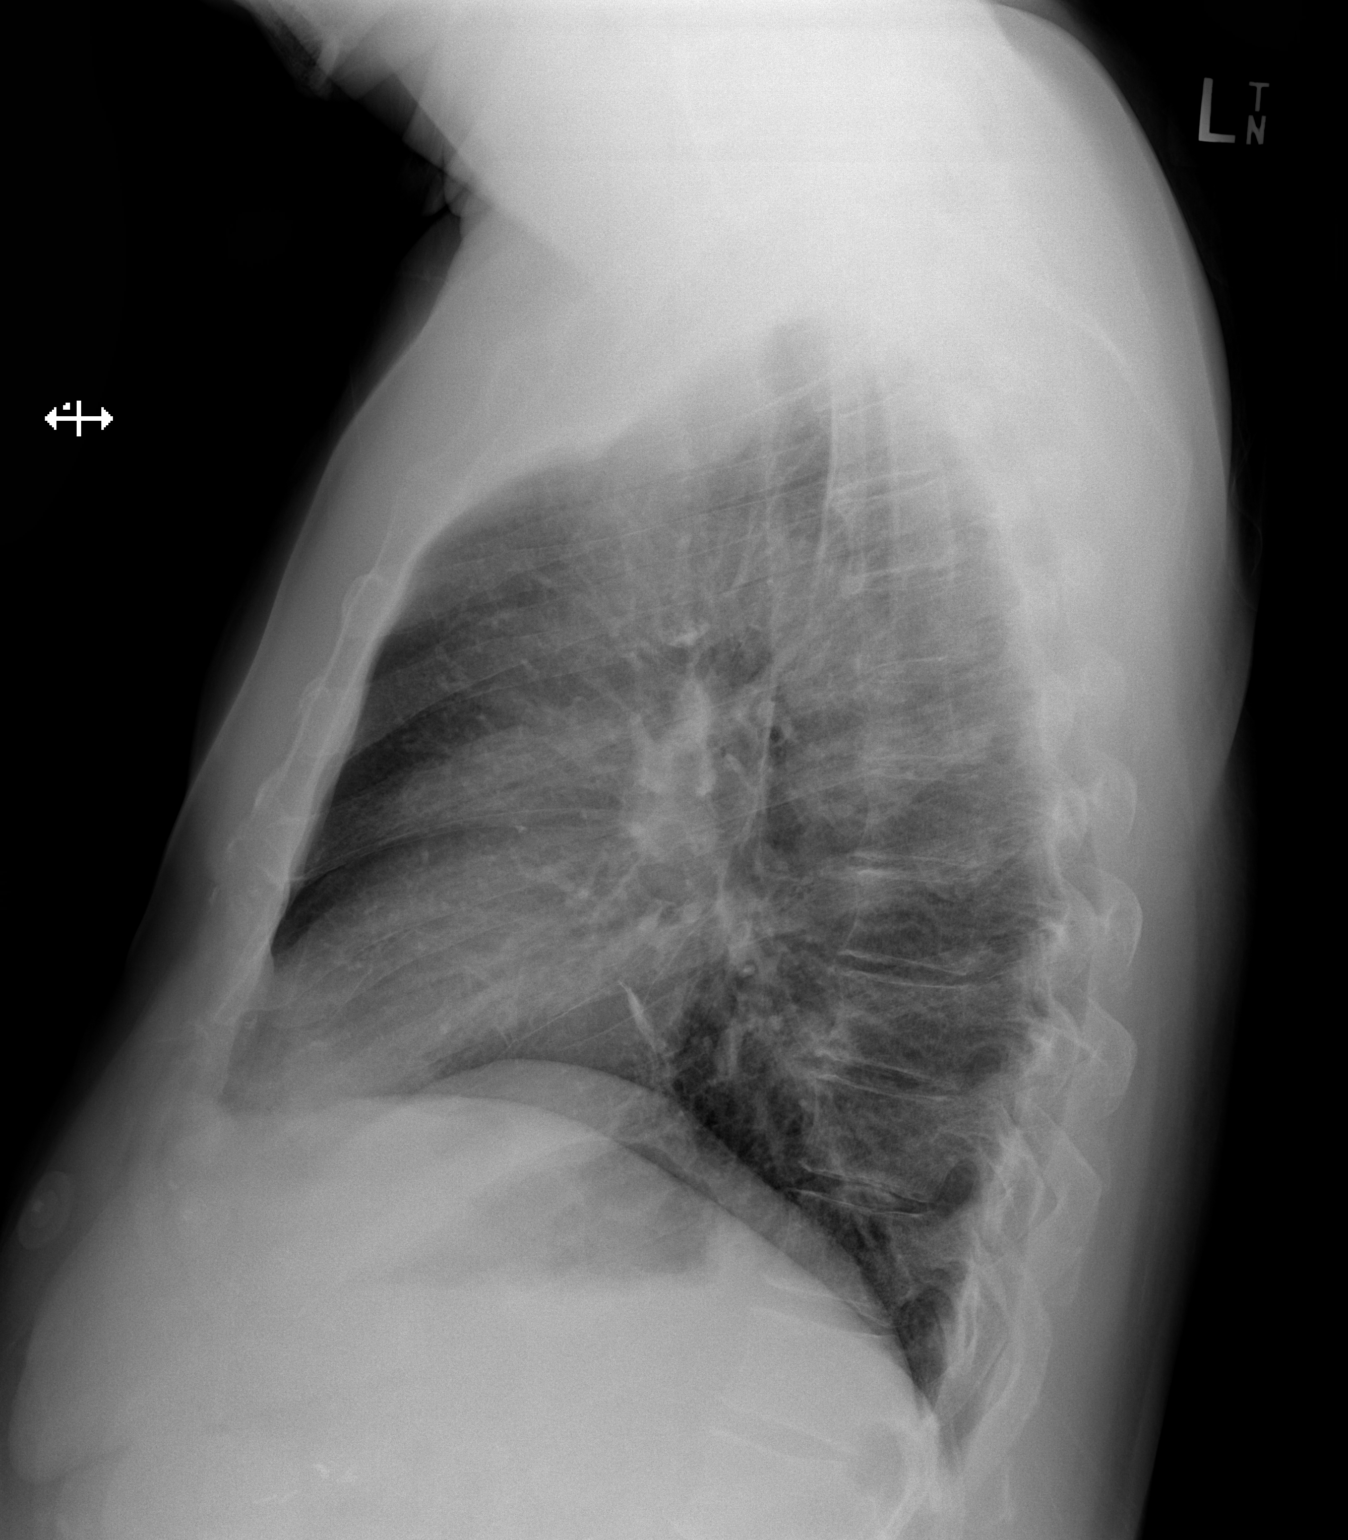

[2 of 2 positions shown; findings below may reference images not displayed]

FINDINGS: The heart size and mediastinal contours are within normal limits.
Both lungs are clear. The visualized skeletal structures are
unremarkable.
IMPRESSION: No active cardiopulmonary disease.

## 2017-11-16 DIAGNOSIS — R110 Nausea: Secondary | ICD-10-CM | POA: Diagnosis not present

## 2017-11-16 DIAGNOSIS — R627 Adult failure to thrive: Secondary | ICD-10-CM | POA: Diagnosis not present

## 2017-11-16 DIAGNOSIS — I951 Orthostatic hypotension: Secondary | ICD-10-CM | POA: Diagnosis not present

## 2017-11-16 DIAGNOSIS — E785 Hyperlipidemia, unspecified: Secondary | ICD-10-CM | POA: Diagnosis not present

## 2017-11-16 DIAGNOSIS — K219 Gastro-esophageal reflux disease without esophagitis: Secondary | ICD-10-CM | POA: Diagnosis not present

## 2017-11-16 DIAGNOSIS — R55 Syncope and collapse: Secondary | ICD-10-CM | POA: Diagnosis not present

## 2017-11-16 DIAGNOSIS — N390 Urinary tract infection, site not specified: Secondary | ICD-10-CM | POA: Diagnosis not present

## 2017-11-16 DIAGNOSIS — R17 Unspecified jaundice: Secondary | ICD-10-CM | POA: Diagnosis not present

## 2017-11-16 DIAGNOSIS — W19XXXA Unspecified fall, initial encounter: Secondary | ICD-10-CM | POA: Diagnosis not present

## 2017-11-16 DIAGNOSIS — E860 Dehydration: Secondary | ICD-10-CM | POA: Diagnosis not present

## 2017-11-16 DIAGNOSIS — Z7901 Long term (current) use of anticoagulants: Secondary | ICD-10-CM | POA: Diagnosis not present

## 2017-11-16 DIAGNOSIS — F3170 Bipolar disorder, currently in remission, most recent episode unspecified: Secondary | ICD-10-CM | POA: Diagnosis not present

## 2017-11-16 DIAGNOSIS — M25519 Pain in unspecified shoulder: Secondary | ICD-10-CM | POA: Diagnosis not present

## 2017-11-16 DIAGNOSIS — C221 Intrahepatic bile duct carcinoma: Secondary | ICD-10-CM | POA: Diagnosis not present

## 2017-11-16 DIAGNOSIS — F250 Schizoaffective disorder, bipolar type: Secondary | ICD-10-CM | POA: Diagnosis not present

## 2017-11-16 DIAGNOSIS — M21371 Foot drop, right foot: Secondary | ICD-10-CM | POA: Diagnosis not present

## 2017-11-16 DIAGNOSIS — R5383 Other fatigue: Secondary | ICD-10-CM | POA: Diagnosis not present

## 2017-11-16 DIAGNOSIS — R630 Anorexia: Secondary | ICD-10-CM | POA: Diagnosis not present

## 2017-11-16 DIAGNOSIS — M5137 Other intervertebral disc degeneration, lumbosacral region: Secondary | ICD-10-CM | POA: Diagnosis not present

## 2017-11-16 DIAGNOSIS — F319 Bipolar disorder, unspecified: Secondary | ICD-10-CM | POA: Diagnosis not present

## 2017-11-16 DIAGNOSIS — R945 Abnormal results of liver function studies: Secondary | ICD-10-CM | POA: Diagnosis not present

## 2017-11-16 DIAGNOSIS — C249 Malignant neoplasm of biliary tract, unspecified: Secondary | ICD-10-CM | POA: Diagnosis not present

## 2017-11-16 DIAGNOSIS — M6281 Muscle weakness (generalized): Secondary | ICD-10-CM | POA: Diagnosis not present

## 2017-11-16 DIAGNOSIS — F064 Anxiety disorder due to known physiological condition: Secondary | ICD-10-CM | POA: Diagnosis not present

## 2017-11-16 DIAGNOSIS — R5381 Other malaise: Secondary | ICD-10-CM | POA: Diagnosis not present

## 2017-11-16 DIAGNOSIS — F331 Major depressive disorder, recurrent, moderate: Secondary | ICD-10-CM | POA: Diagnosis not present

## 2017-11-16 DIAGNOSIS — H52209 Unspecified astigmatism, unspecified eye: Secondary | ICD-10-CM | POA: Diagnosis not present

## 2017-11-16 DIAGNOSIS — F339 Major depressive disorder, recurrent, unspecified: Secondary | ICD-10-CM | POA: Diagnosis not present

## 2017-11-16 DIAGNOSIS — I10 Essential (primary) hypertension: Secondary | ICD-10-CM | POA: Diagnosis not present

## 2017-11-16 DIAGNOSIS — R509 Fever, unspecified: Secondary | ICD-10-CM | POA: Diagnosis not present

## 2017-11-16 DIAGNOSIS — E876 Hypokalemia: Secondary | ICD-10-CM | POA: Diagnosis not present

## 2017-11-16 DIAGNOSIS — F17210 Nicotine dependence, cigarettes, uncomplicated: Secondary | ICD-10-CM | POA: Diagnosis not present

## 2017-11-17 DIAGNOSIS — C249 Malignant neoplasm of biliary tract, unspecified: Secondary | ICD-10-CM | POA: Diagnosis not present

## 2017-11-17 DIAGNOSIS — F3170 Bipolar disorder, currently in remission, most recent episode unspecified: Secondary | ICD-10-CM | POA: Diagnosis not present

## 2017-11-17 DIAGNOSIS — R627 Adult failure to thrive: Secondary | ICD-10-CM | POA: Diagnosis not present

## 2017-11-17 DIAGNOSIS — I10 Essential (primary) hypertension: Secondary | ICD-10-CM | POA: Diagnosis not present

## 2017-11-20 DIAGNOSIS — W19XXXA Unspecified fall, initial encounter: Secondary | ICD-10-CM | POA: Diagnosis not present

## 2017-11-20 DIAGNOSIS — M6281 Muscle weakness (generalized): Secondary | ICD-10-CM | POA: Diagnosis not present

## 2017-11-20 DIAGNOSIS — F17210 Nicotine dependence, cigarettes, uncomplicated: Secondary | ICD-10-CM | POA: Diagnosis not present

## 2017-11-20 DIAGNOSIS — R55 Syncope and collapse: Secondary | ICD-10-CM | POA: Diagnosis not present

## 2017-11-23 DIAGNOSIS — R509 Fever, unspecified: Secondary | ICD-10-CM | POA: Diagnosis not present

## 2017-11-23 DIAGNOSIS — R5383 Other fatigue: Secondary | ICD-10-CM | POA: Diagnosis not present

## 2017-11-23 DIAGNOSIS — C221 Intrahepatic bile duct carcinoma: Secondary | ICD-10-CM | POA: Diagnosis not present

## 2017-11-23 DIAGNOSIS — R5381 Other malaise: Secondary | ICD-10-CM | POA: Diagnosis not present

## 2017-11-23 DIAGNOSIS — E860 Dehydration: Secondary | ICD-10-CM | POA: Diagnosis not present

## 2017-11-24 DIAGNOSIS — C221 Intrahepatic bile duct carcinoma: Secondary | ICD-10-CM | POA: Diagnosis not present

## 2017-11-24 DIAGNOSIS — R5381 Other malaise: Secondary | ICD-10-CM | POA: Diagnosis not present

## 2017-11-24 DIAGNOSIS — E876 Hypokalemia: Secondary | ICD-10-CM | POA: Diagnosis not present

## 2017-11-24 DIAGNOSIS — R509 Fever, unspecified: Secondary | ICD-10-CM | POA: Diagnosis not present

## 2017-11-24 DIAGNOSIS — R5383 Other fatigue: Secondary | ICD-10-CM | POA: Diagnosis not present

## 2017-11-28 DIAGNOSIS — R5381 Other malaise: Secondary | ICD-10-CM | POA: Diagnosis not present

## 2017-11-28 DIAGNOSIS — N390 Urinary tract infection, site not specified: Secondary | ICD-10-CM | POA: Diagnosis not present

## 2017-11-28 DIAGNOSIS — R5383 Other fatigue: Secondary | ICD-10-CM | POA: Diagnosis not present

## 2017-11-28 DIAGNOSIS — C221 Intrahepatic bile duct carcinoma: Secondary | ICD-10-CM | POA: Diagnosis not present

## 2017-11-28 DIAGNOSIS — R509 Fever, unspecified: Secondary | ICD-10-CM | POA: Diagnosis not present

## 2017-11-30 DIAGNOSIS — R5381 Other malaise: Secondary | ICD-10-CM | POA: Diagnosis not present

## 2017-11-30 DIAGNOSIS — C221 Intrahepatic bile duct carcinoma: Secondary | ICD-10-CM | POA: Diagnosis not present

## 2017-11-30 DIAGNOSIS — N390 Urinary tract infection, site not specified: Secondary | ICD-10-CM | POA: Diagnosis not present

## 2017-11-30 DIAGNOSIS — R945 Abnormal results of liver function studies: Secondary | ICD-10-CM | POA: Diagnosis not present

## 2017-12-05 DIAGNOSIS — R5381 Other malaise: Secondary | ICD-10-CM | POA: Diagnosis not present

## 2017-12-05 DIAGNOSIS — R17 Unspecified jaundice: Secondary | ICD-10-CM | POA: Diagnosis not present

## 2017-12-05 DIAGNOSIS — R5383 Other fatigue: Secondary | ICD-10-CM | POA: Diagnosis not present

## 2017-12-05 DIAGNOSIS — C221 Intrahepatic bile duct carcinoma: Secondary | ICD-10-CM | POA: Diagnosis not present

## 2017-12-13 DIAGNOSIS — R110 Nausea: Secondary | ICD-10-CM | POA: Diagnosis not present

## 2017-12-13 DIAGNOSIS — C221 Intrahepatic bile duct carcinoma: Secondary | ICD-10-CM | POA: Diagnosis not present

## 2017-12-13 DIAGNOSIS — R630 Anorexia: Secondary | ICD-10-CM | POA: Diagnosis not present

## 2017-12-13 DIAGNOSIS — R5381 Other malaise: Secondary | ICD-10-CM | POA: Diagnosis not present

## 2017-12-19 DIAGNOSIS — R627 Adult failure to thrive: Secondary | ICD-10-CM | POA: Diagnosis not present

## 2017-12-19 DIAGNOSIS — I10 Essential (primary) hypertension: Secondary | ICD-10-CM | POA: Diagnosis not present

## 2017-12-19 DIAGNOSIS — R5381 Other malaise: Secondary | ICD-10-CM | POA: Diagnosis not present

## 2017-12-19 DIAGNOSIS — C221 Intrahepatic bile duct carcinoma: Secondary | ICD-10-CM | POA: Diagnosis not present

## 2017-12-19 DIAGNOSIS — M6281 Muscle weakness (generalized): Secondary | ICD-10-CM | POA: Diagnosis not present

## 2018-01-26 DEATH — deceased
